# Patient Record
Sex: Male | Born: 1960 | Race: Black or African American | Hispanic: No | Smoking: Never smoker
Health system: Southern US, Community
[De-identification: ages and names within clinical notes are randomized; demographics above are authoritative.]

## PROBLEM LIST (undated history)

## (undated) DIAGNOSIS — M1990 Unspecified osteoarthritis, unspecified site: Secondary | ICD-10-CM

## (undated) DIAGNOSIS — F319 Bipolar disorder, unspecified: Secondary | ICD-10-CM

---

## 2005-09-18 ENCOUNTER — Emergency Department: Payer: Self-pay | Admitting: Emergency Medicine

## 2008-07-29 ENCOUNTER — Ambulatory Visit: Payer: Self-pay

## 2008-10-18 ENCOUNTER — Emergency Department: Payer: Self-pay | Admitting: Emergency Medicine

## 2011-06-05 ENCOUNTER — Emergency Department: Payer: Self-pay | Admitting: Emergency Medicine

## 2015-02-13 ENCOUNTER — Emergency Department
Admission: EM | Admit: 2015-02-13 | Discharge: 2015-02-13 | Disposition: A | Discharge status: Home / Self Care | Payer: Self-pay | Attending: Emergency Medicine | Admitting: Emergency Medicine

## 2015-02-13 ENCOUNTER — Encounter: Payer: Self-pay | Admitting: Emergency Medicine

## 2015-02-13 DIAGNOSIS — Z202 Contact with and (suspected) exposure to infections with a predominantly sexual mode of transmission: Secondary | ICD-10-CM | POA: Insufficient documentation

## 2015-02-13 DIAGNOSIS — N341 Nonspecific urethritis: Secondary | ICD-10-CM | POA: Insufficient documentation

## 2015-02-13 HISTORY — DX: Bipolar disorder, unspecified: F31.9

## 2015-02-13 HISTORY — DX: Unspecified osteoarthritis, unspecified site: M19.90

## 2015-02-13 LAB — URINALYSIS COMPLETE WITH MICROSCOPIC (ARMC ONLY)
Bacteria, UA: NONE SEEN
Bilirubin Urine: NEGATIVE
GLUCOSE, UA: NEGATIVE mg/dL
Hgb urine dipstick: NEGATIVE
Nitrite: NEGATIVE
PROTEIN: NEGATIVE mg/dL
Specific Gravity, Urine: 1.019 (ref 1.005–1.030)
Squamous Epithelial / LPF: NONE SEEN
pH: 6 (ref 5.0–8.0)

## 2015-02-13 LAB — CHLAMYDIA/NGC RT PCR (ARMC ONLY)
CHLAMYDIA TR: NOT DETECTED
N gonorrhoeae: DETECTED — AB

## 2015-02-13 MED ORDER — AZITHROMYCIN 250 MG PO TABS
1000.0000 mg | ORAL_TABLET | Freq: Once | ORAL | Status: AC
  Administered 2015-02-13: 1000 mg via ORAL
  Filled 2015-02-13: qty 4

## 2015-02-13 MED ORDER — CEFTRIAXONE SODIUM 250 MG IJ SOLR
250.0000 mg | Freq: Once | INTRAMUSCULAR | Status: AC
  Administered 2015-02-13: 250 mg via INTRAMUSCULAR
  Filled 2015-02-13: qty 250

## 2015-02-13 NOTE — ED Notes (Signed)
Patient reports white penile discharge. Believes he may have STD.

## 2015-02-13 NOTE — ED Provider Notes (Signed)
[facility] Emergency Department Provider Note  ____________________________________________  Time seen: Approximately 8:26 AM  I have reviewed the triage vital signs and the nursing notes.   HISTORY  Chief Complaint Exposure to STD and Penile Discharge   HPI Blake Hall is a 54 y.o. male presents for evaluation of penile discharge 1. Patient states recently sexual way active with a new partner approximately 3 days ago. Denies any other symptoms. Complains of burning upon urination as well.   Past Medical History  Diagnosis Date  . Arthritis   . Bipolar 1 disorder (HCC)     There are no active problems to display for this patient.   History reviewed. No pertinent past surgical history.  No current outpatient prescriptions on file.  Allergies Review of patient's allergies indicates no known allergies.  No family history on file.  Social History Social History  Substance Use Topics  . Smoking status: Never Smoker   . Smokeless tobacco: None  . Alcohol Use: Yes    Review of Systems Constitutional: No fever/chills Eyes: No visual changes. ENT: No sore throat. Cardiovascular: Denies chest pain. Respiratory: Denies shortness of breath. Gastrointestinal: No abdominal pain.  No nausea, no vomiting.  No diarrhea.  No constipation. Genitourinary: Negative for dysuria. Positive for penile discharge. Musculoskeletal: Negative for back pain. Skin: Negative for rash. Neurological: Negative for headaches, focal weakness or numbness.  10-point ROS otherwise negative.  ____________________________________________   PHYSICAL EXAM:  VITAL SIGNS: ED Triage Vitals  Enc Vitals Group     BP 02/13/15 0819 111/95 mmHg     Pulse Rate 02/13/15 0819 85     Resp 02/13/15 0819 16     Temp 02/13/15 0819 97.8 F (36.6 C)     Temp Source 02/13/15 0819 Oral     SpO2 02/13/15 0819 100 %     Weight 02/13/15 0819 150 lb (68.04 kg)     Height 02/13/15  0819  (1.676 m)     Head Cir --      Peak Flow --      Pain Score 02/13/15 0820 3     Pain Loc --      Pain Edu? --      Excl. in GC? --     Constitutional: Alert and oriented. Well appearing and in no acute distress.   Cardiovascular: Normal rate, regular rhythm. Grossly normal heart sounds.  Good peripheral circulation. Respiratory: Normal respiratory effort.  No retractions. Lungs CTAB. Gastrointestinal: Soft and nontender. No distention. No abdominal bruits. No CVA tenderness. Genitourinary:  Musculoskeletal: No lower extremity tenderness nor edema.  No joint effusions. Neurologic:  Normal speech and language. No gross focal neurologic deficits are appreciated. No gait instability. Skin:  Skin is warm, dry and intact. No rash noted. Psychiatric: Mood and affect are normal. Speech and behavior are normal.  ____________________________________________   LABS (all labs ordered are listed, but only abnormal results are displayed)  Labs Reviewed  URINALYSIS COMPLETEWITH MICROSCOPIC ([facility] ONLY) - Abnormal; Notable for the following:    Color, Urine YELLOW (*)    APPearance HAZY (*)    Ketones, ur TRACE (*)    Leukocytes, UA 3+ (*)    All other components within normal limits  CHLAMYDIA/NGC RT PCR ([facility] ONLY)   ____________________________________________   PROCEDURES  Procedure(s) performed: None  Critical Care performed: No  ____________________________________________   INITIAL IMPRESSION / ASSESSMENT AND PLAN / ED COURSE  Pertinent labs & imaging results that were available during  my care of the patient were reviewed by me and considered in my medical decision making (see chart for details).  Acute urethritis. Rocephin 250 IM, azithromycin 1 g by mouth. Both given in the ED. Patient to notify all sexual contacts about discharge and to have him follow up with her PCP. Patient voices no other emergency medical complaints at this  time. ____________________________________________   FINAL CLINICAL IMPRESSION(S) / ED DIAGNOSES  Final diagnoses:  Urethritis, nonspecific       Evangeline DakinCharles M Casey Maxfield, PA-C 02/13/15 [id number]  Myrna Blazeravid Matthew Schaevitz, MD 02/13/15 [phone]

## 2015-02-13 NOTE — Discharge Instructions (Signed)
Urethritis, Adult °Urethritis is an inflammation of the tube through which urine exits your bladder (urethra).  °CAUSES °Urethritis is often caused by an infection in your urethra. The infection can be viral, like herpes. The infection can also be bacterial, like gonorrhea. °RISK FACTORS °Risk factors of urethritis include: °· Having sex without using a condom. °· Having multiple sexual partners. °· Having poor hygiene. °SIGNS AND SYMPTOMS °Symptoms of urethritis are less noticeable in women than in men. These symptoms include: °· Burning feeling when you urinate (dysuria). °· Discharge from your urethra. °· Blood in your urine (hematuria). °· Urinating more than usual. °DIAGNOSIS  °To confirm a diagnosis of urethritis, your health care provider will do the following: °· Ask about your sexual history. °· Perform a physical exam. °· Have you provide a sample of your urine for lab testing. °· Use a cotton swab to gently collect a sample from your urethra for lab testing. °TREATMENT  °It is important to treat urethritis. Depending on the cause, untreated urethritis may lead to serious genital infections and possibly infertility. Urethritis caused by a bacterial infection is treated with antibiotic medicine. All sexual partners must be treated.  °HOME CARE INSTRUCTIONS °· Do not have sex until the test results are known and treatment is completed, even if your symptoms go away before you finish treatment. °· If you were prescribed an antibiotic, finish it all even if you start to feel better. °SEEK MEDICAL CARE IF:  °· Your symptoms are not improved in 3 days. °· Your symptoms are getting worse. °· You develop abdominal pain or pelvic pain (in women). °· You develop joint pain. °· You have a fever. °SEEK IMMEDIATE MEDICAL CARE IF:  °· You have severe pain in the belly, back, or side. °· You have repeated vomiting. °MAKE SURE YOU: °· Understand these instructions. °· Will watch your condition. °· Will get help right away  if you are not doing well or get worse. °  °This information is not intended to replace advice given to you by your health care provider. Make sure you discuss any questions you have with your health care provider. °  °Document Released: 08/01/2000 Document Revised: 06/22/2014 Document Reviewed: 10/06/2012 °Elsevier Interactive Patient Education ©2016 [employer] ° °

## 2015-02-16 ENCOUNTER — Telehealth: Payer: Self-pay | Admitting: Emergency Medicine

## 2015-02-16 NOTE — ED Notes (Signed)
Called pt to inform of positive gonorrhea test. Patient was treated during his visit, but needs to notify partners to get treatment at [facility] department.  No answer and no voicemail.  Will send letter.

## 2020-10-26 ENCOUNTER — Other Ambulatory Visit: Payer: Self-pay | Admitting: Radiation Oncology

## 2020-10-26 DIAGNOSIS — G589 Mononeuropathy, unspecified: Secondary | ICD-10-CM

## 2020-10-26 DIAGNOSIS — M25551 Pain in right hip: Secondary | ICD-10-CM

## 2020-11-11 ENCOUNTER — Other Ambulatory Visit: Payer: Self-pay | Admitting: Radiation Oncology

## 2020-11-11 ENCOUNTER — Ambulatory Visit
Admission: RE | Admit: 2020-11-11 | Discharge: 2020-11-11 | Disposition: A | Discharge status: Home / Self Care | Payer: Disability Insurance | Attending: Radiation Oncology | Admitting: Radiation Oncology

## 2020-11-11 ENCOUNTER — Ambulatory Visit
Admission: RE | Admit: 2020-11-11 | Discharge: 2020-11-11 | Disposition: A | Discharge status: Home / Self Care | Payer: Disability Insurance | Source: Ambulatory Visit | Attending: Radiation Oncology | Admitting: Radiation Oncology

## 2020-11-11 ENCOUNTER — Other Ambulatory Visit: Payer: Self-pay

## 2020-11-11 DIAGNOSIS — M25551 Pain in right hip: Secondary | ICD-10-CM | POA: Diagnosis present

## 2020-11-11 DIAGNOSIS — G589 Mononeuropathy, unspecified: Secondary | ICD-10-CM | POA: Diagnosis present

## 2022-03-18 IMAGING — CR DG HIP (WITH OR WITHOUT PELVIS) 2-3V*R*
1 series · 3 of 3 positions shown · non-contrast
Comparison: None.

CLINICAL DATA: Chronic right hip pain

EXAM:
DG HIP (WITH OR WITHOUT PELVIS) 2-3V RIGHT

[Series 1: dg hip unilat w or w/o pelvis 2-3 views  · non-contrast · 0.14mm/px · 3 of 3 slices shown]
[im 1/3]
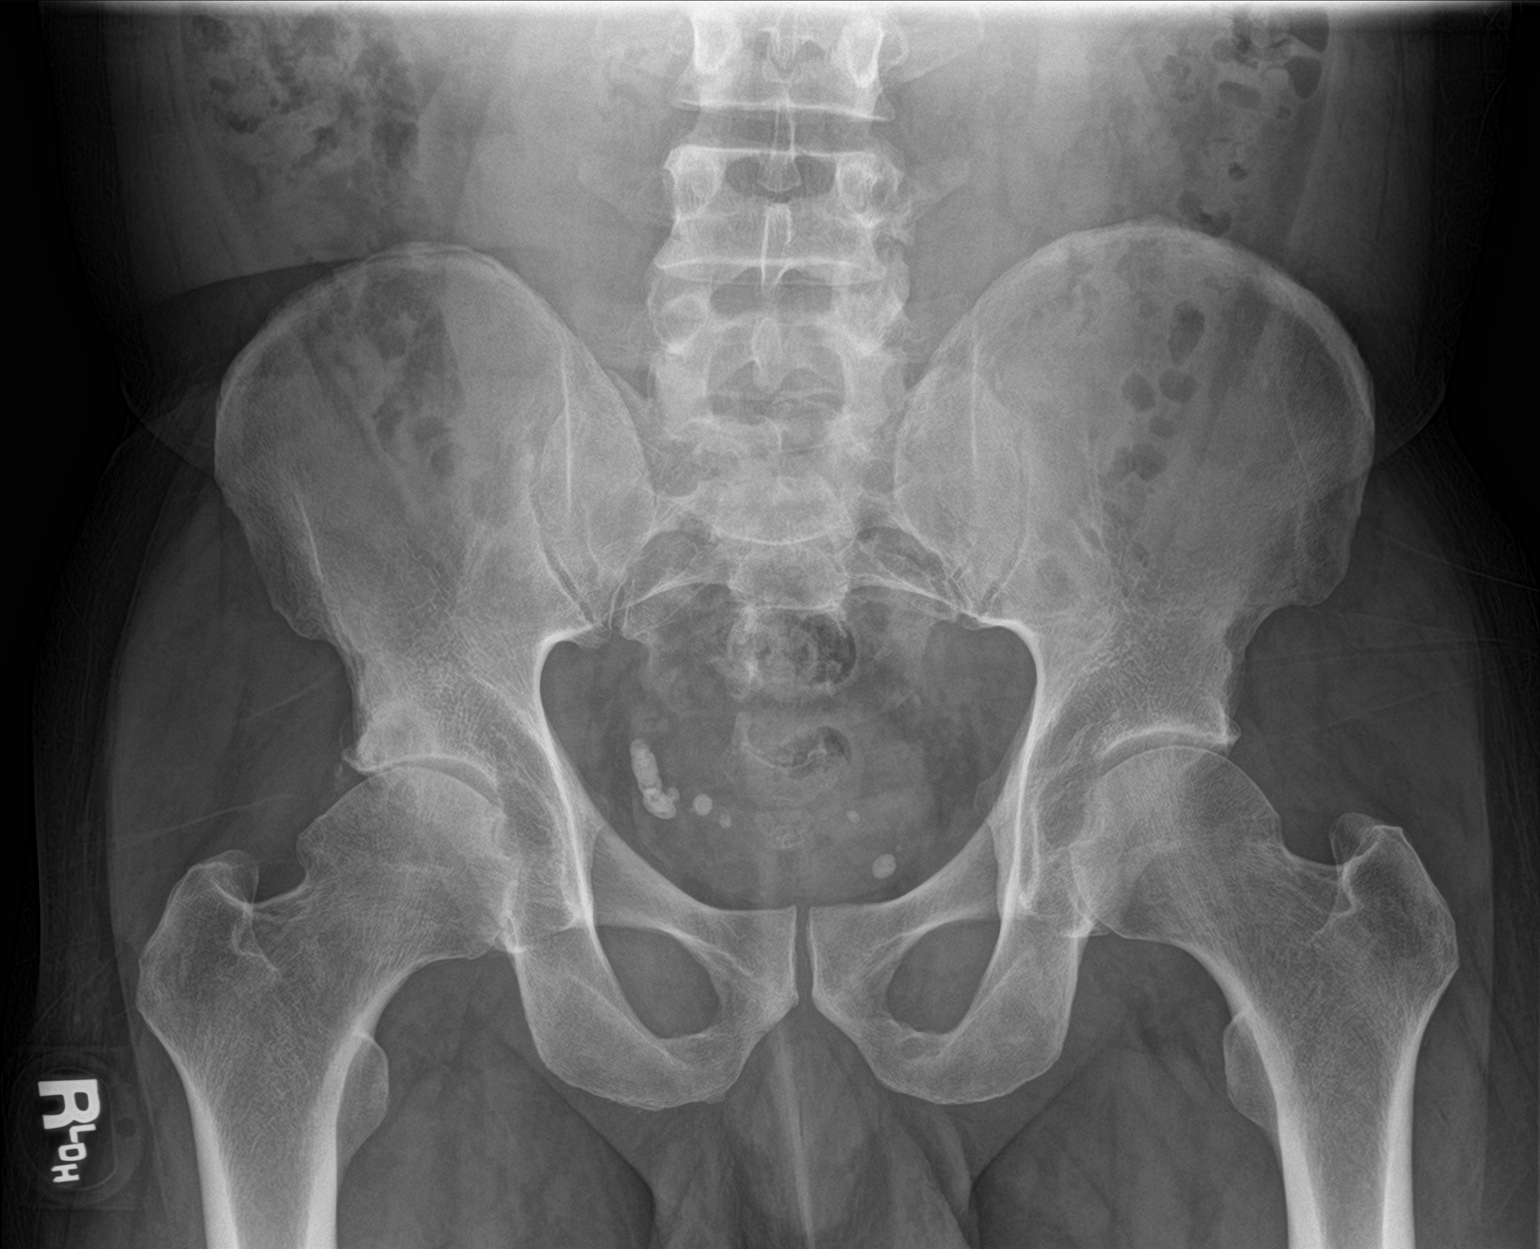
[im 2/3]
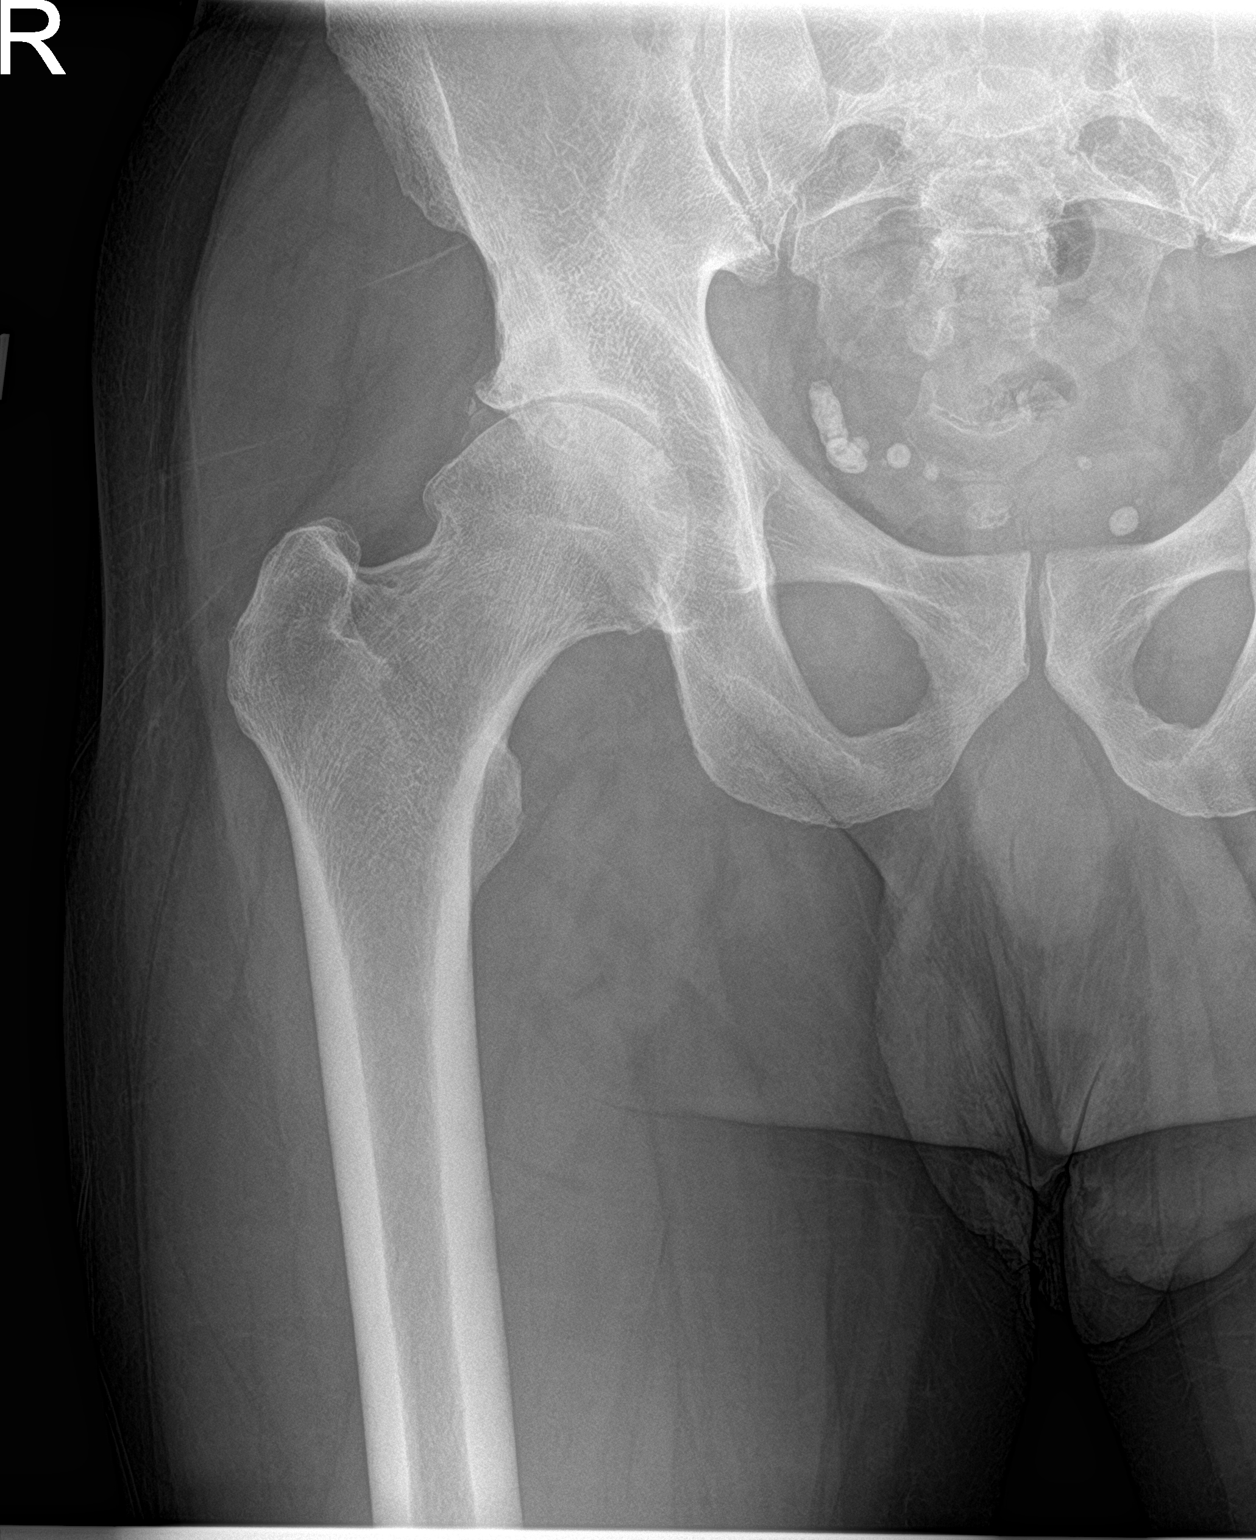
[im 3/3]
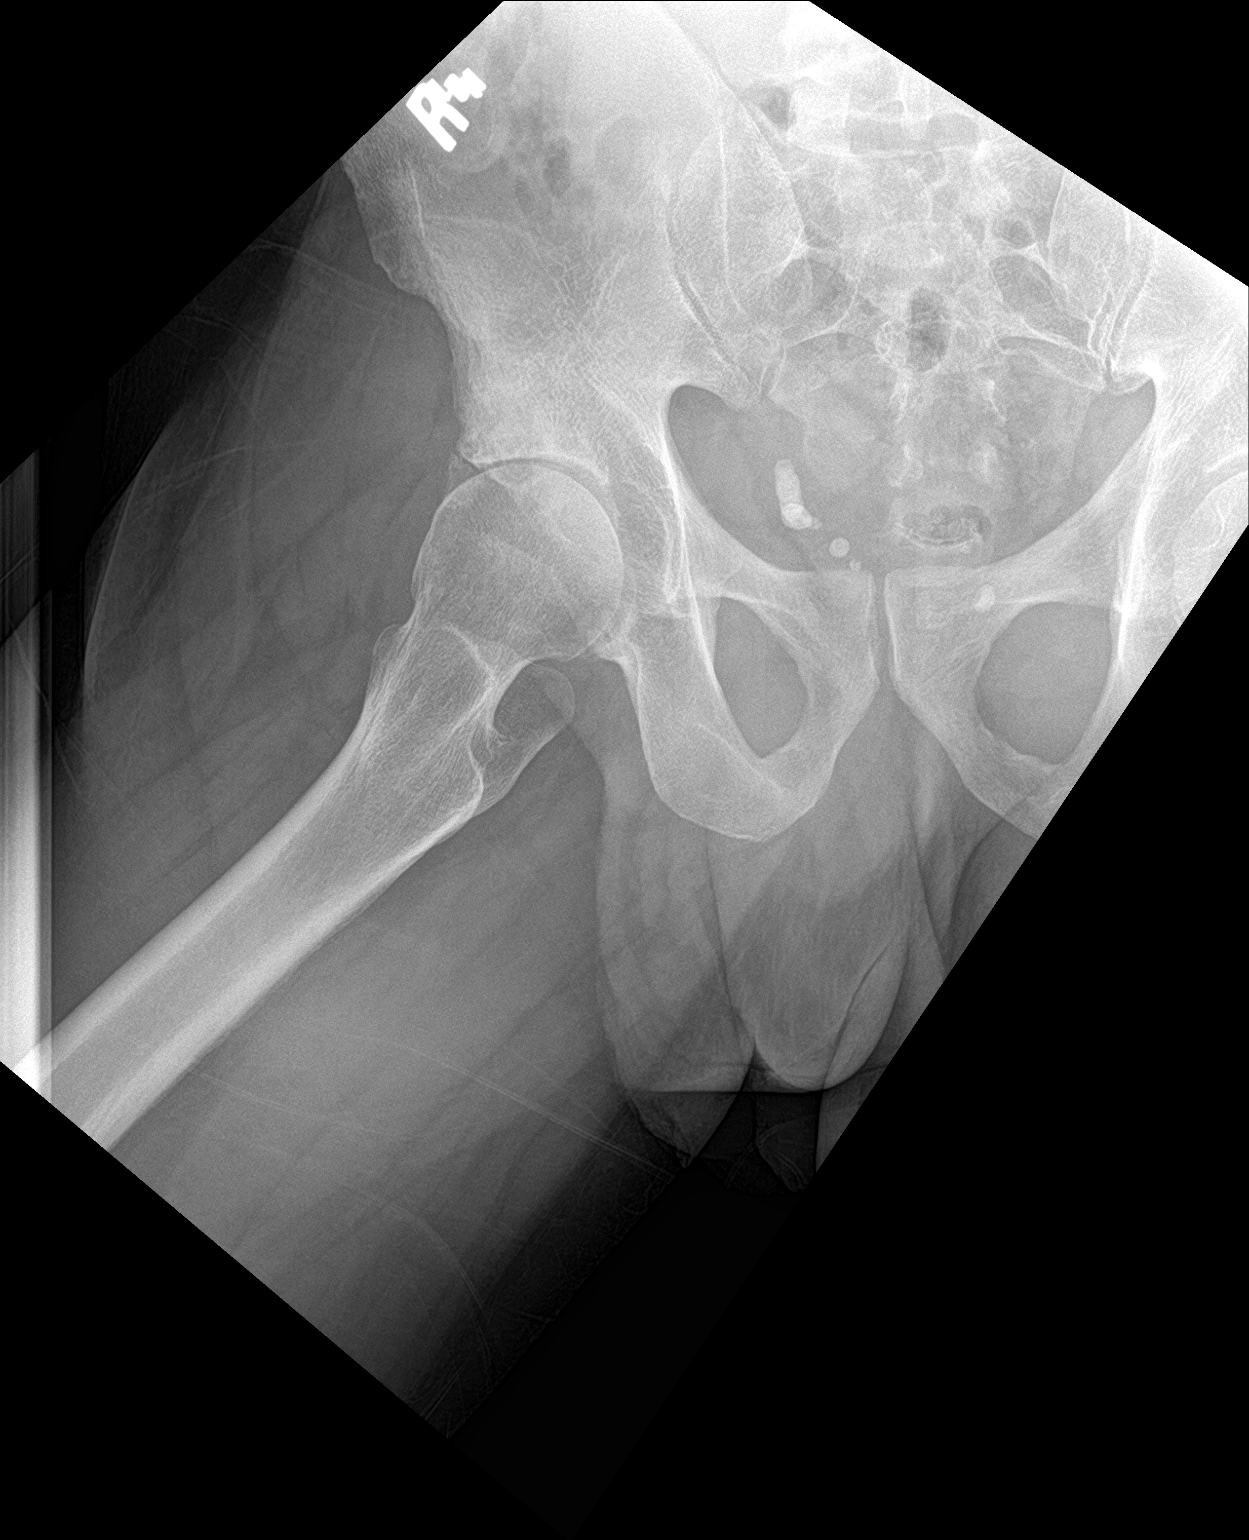

[3 of 3 positions shown; findings below may reference images not displayed]

FINDINGS: Normal alignment. No fracture or dislocation. There is moderate
asymmetric right hip degenerative arthritis with subchondral cyst
formation within the femoral head and acetabular roof. There is
reduced spherocity of the right femoral head suggesting changes of
cam type hip impingement. Mild left hip degenerative arthritis is
noted. Vascular calcifications are seen within the pelvis.
IMPRESSION: Moderate asymmetric right hip degenerative arthritis, possibly
related to changes of underlying hip impingement. This could be
better assessed with MRI examination, if indicated.

## 2022-03-18 IMAGING — CR DG LUMBAR SPINE 2-3V
1 series · 3 of 3 positions shown · non-contrast
Comparison: None.

CLINICAL DATA: Chronic low back and right hip pain

EXAM:
LUMBAR SPINE - 2-3 VIEW

[Series 1: dg lumbar spine 2-3 views · 0.14mm/px · 3 of 3 slices shown]
[im 1/3]
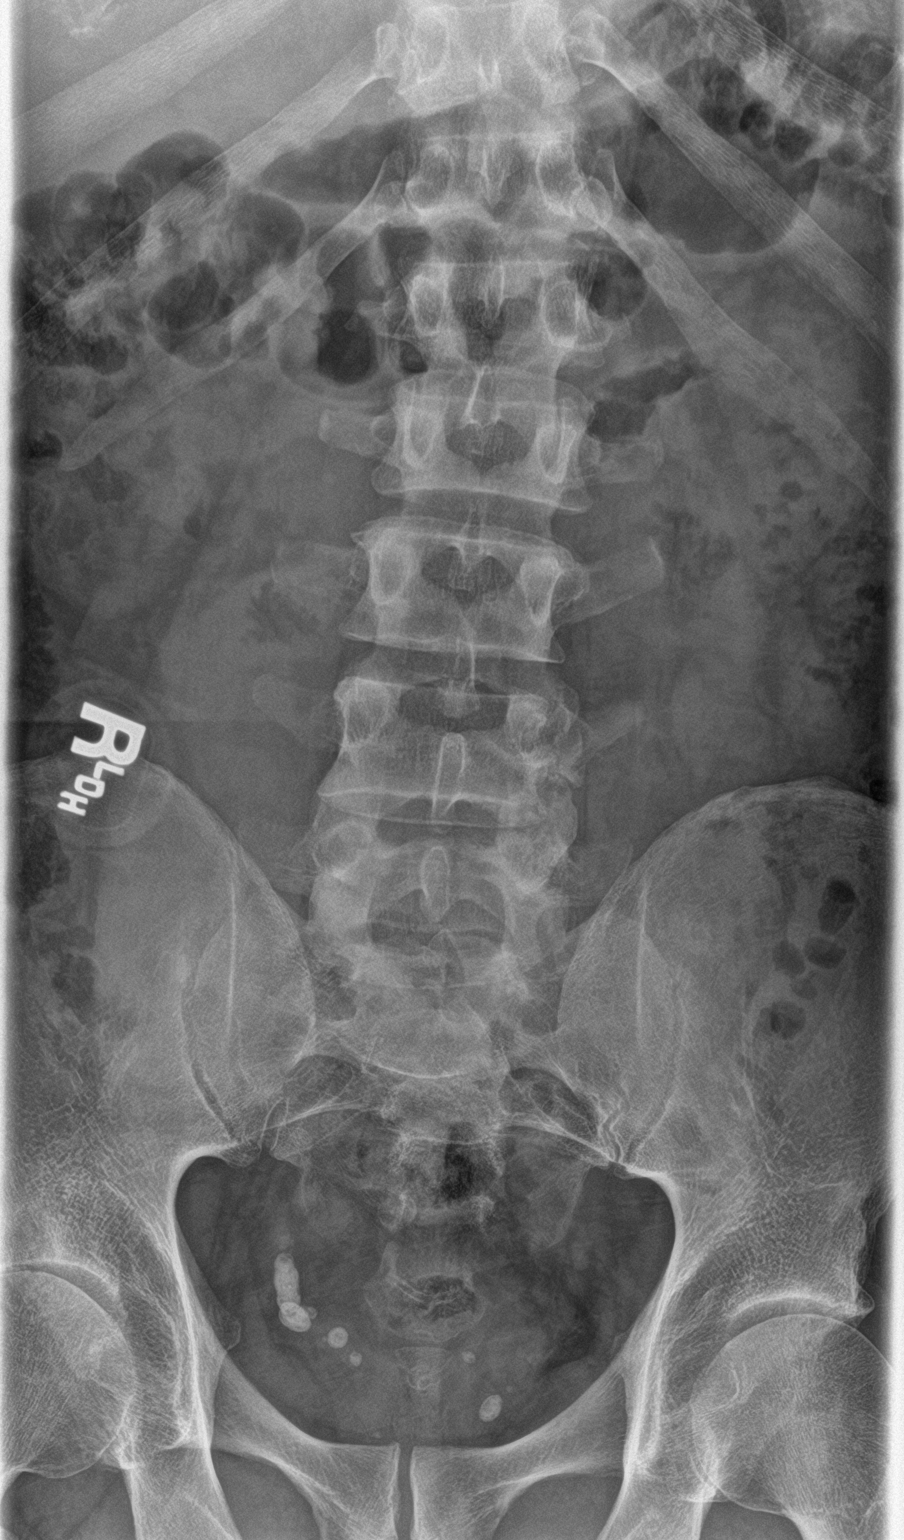
[im 2/3]
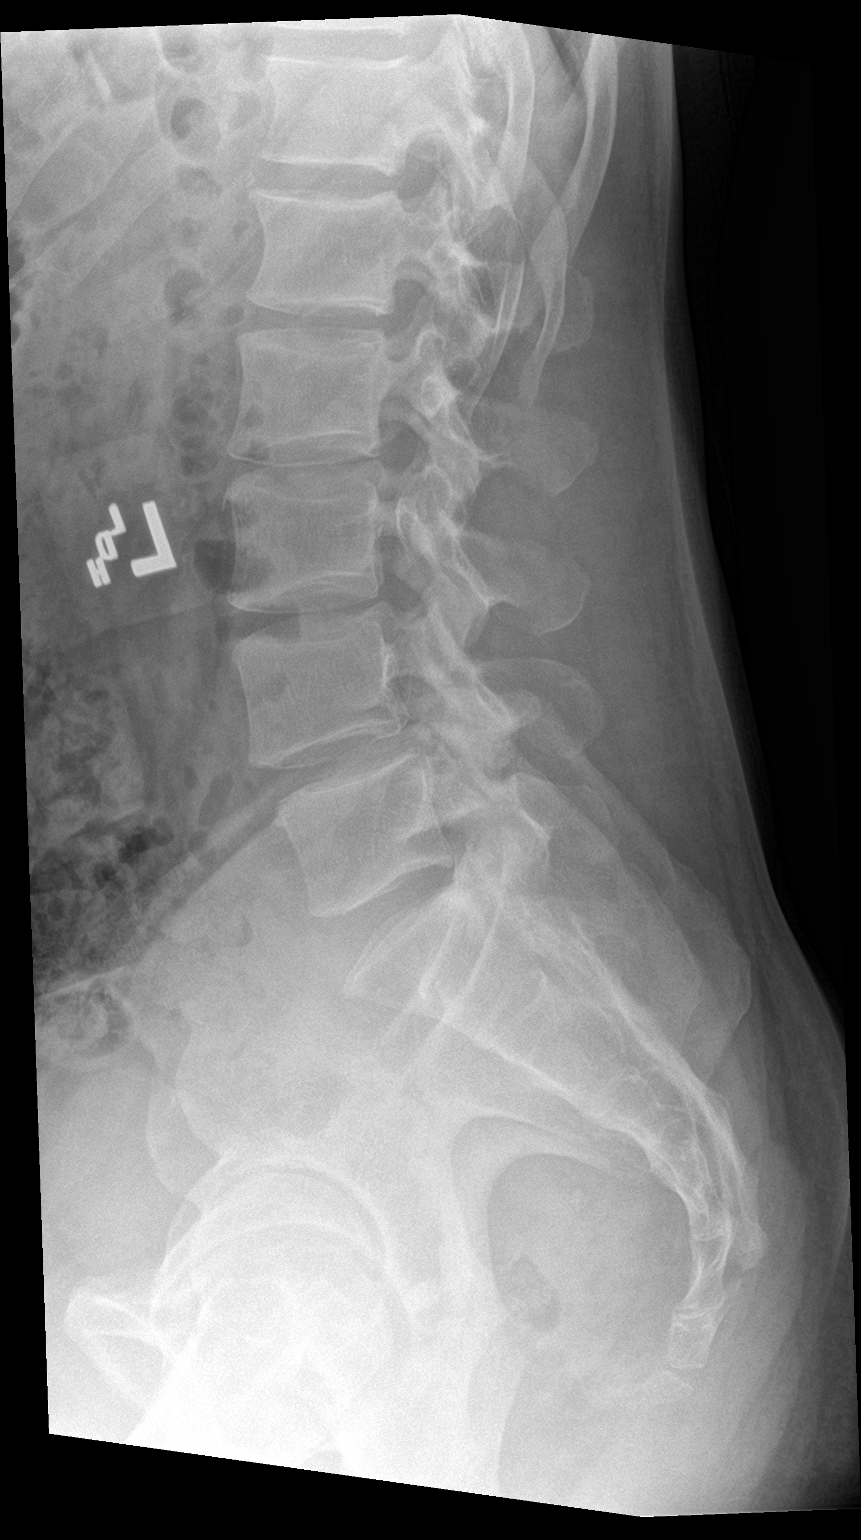
[im 3/3]
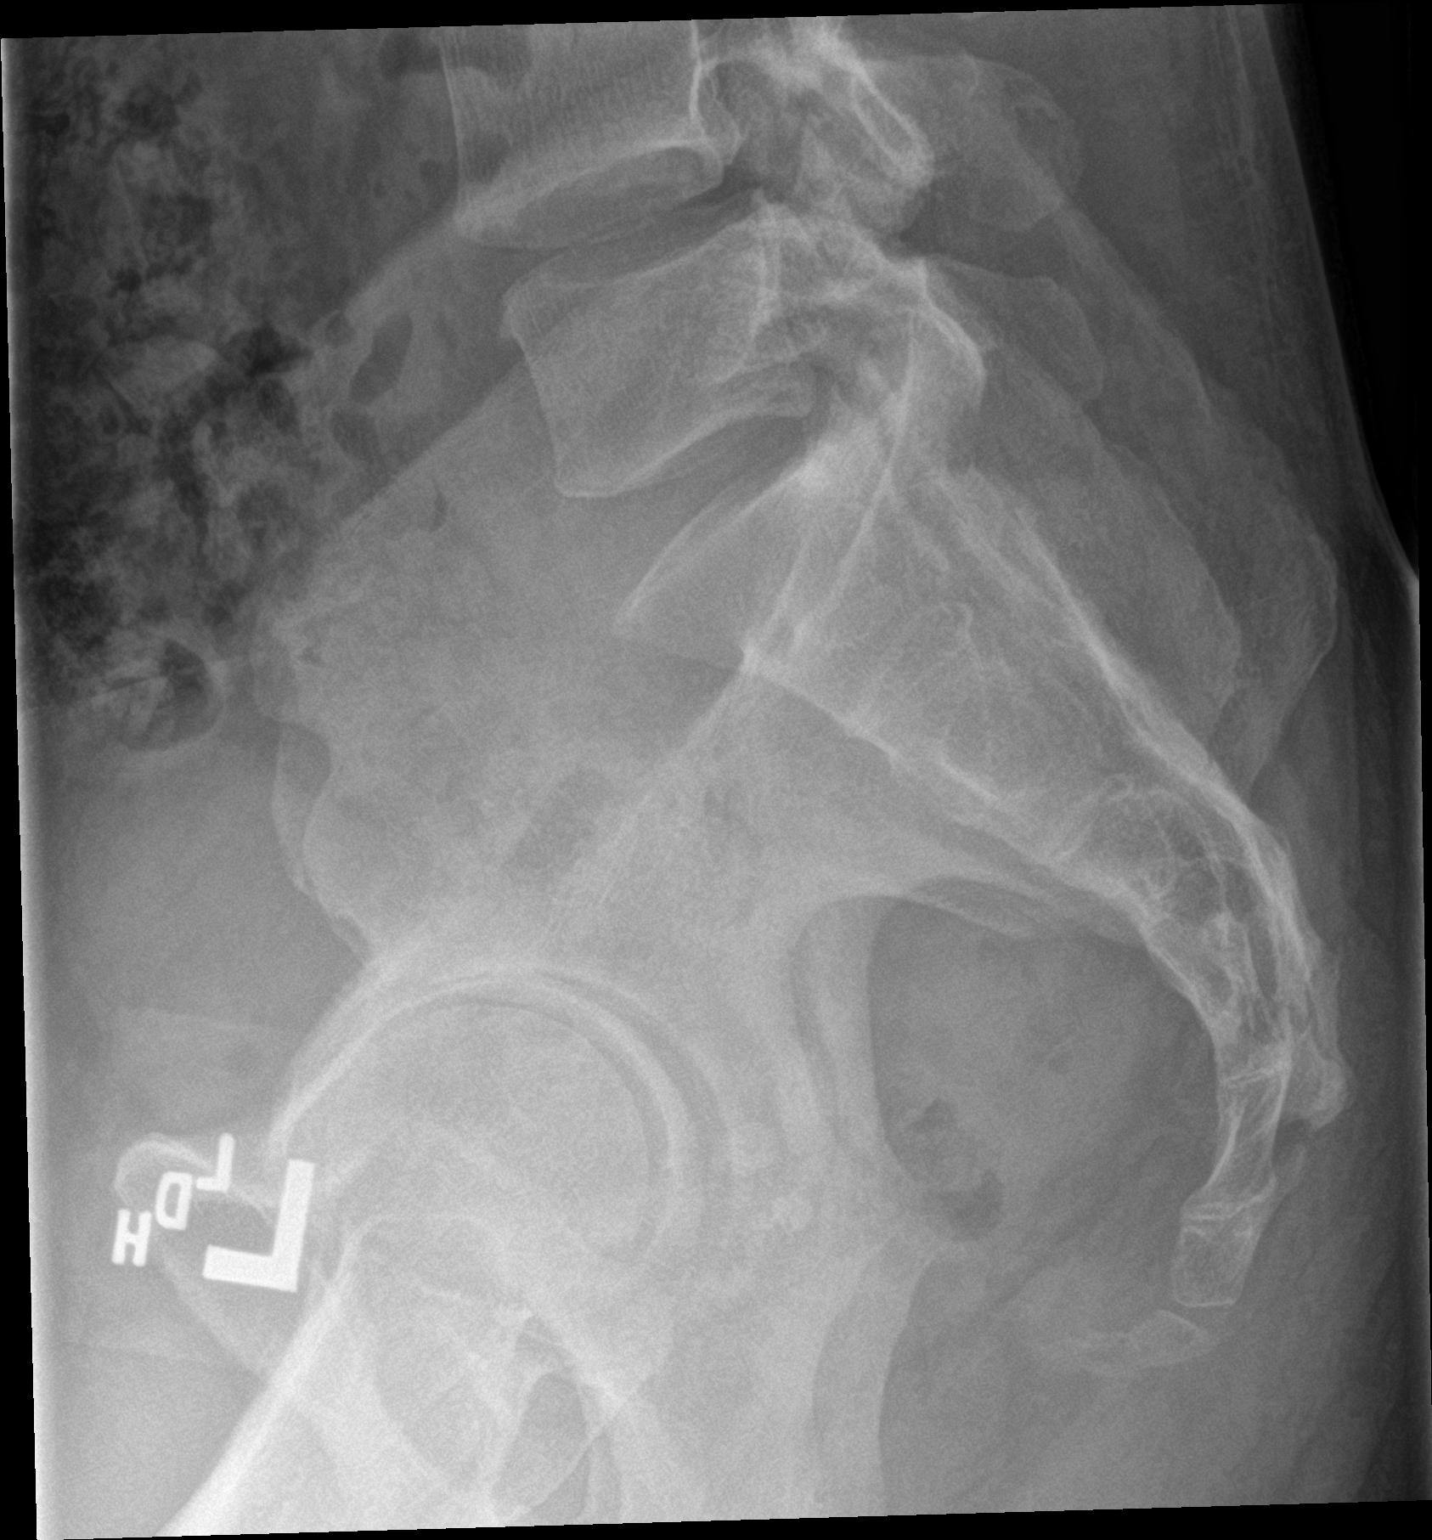

[3 of 3 positions shown; findings below may reference images not displayed]

FINDINGS: Mild thoracolumbar levocurvature, apex left at T12, incompletely
visualized on this examination. Normal lumbar lordosis. No acute
fracture or listhesis of the lumbar spine. Vertebral body height and
intervertebral disc heights are preserved. There is asymmetric left
L4-5 facet arthrosis noted, not well profiled on this examination.
The paraspinal soft tissues are otherwise unremarkable. Incidentally
noted is asymmetric right hip joint space narrowing, in keeping with
asymmetric right hip degenerative arthritis
IMPRESSION: Asymmetric left L4-5 facet arthrosis, not well profiled on this
examination.

Asymmetric right hip degenerative arthritis, incompletely included
on this exam

## 2023-06-10 ENCOUNTER — Ambulatory Visit: Payer: Self-pay | Admitting: Internal Medicine

## 2023-11-14 ENCOUNTER — Other Ambulatory Visit: Payer: Self-pay

## 2023-11-14 ENCOUNTER — Ambulatory Visit (INDEPENDENT_AMBULATORY_CARE_PROVIDER_SITE_OTHER)

## 2023-11-14 ENCOUNTER — Telehealth: Payer: Self-pay

## 2023-11-14 VITALS — BP 110/70 | HR 77 | Ht 66.0 in | Wt 165.6 lb

## 2023-11-14 DIAGNOSIS — L731 Pseudofolliculitis barbae: Secondary | ICD-10-CM | POA: Diagnosis not present

## 2023-11-14 DIAGNOSIS — M1611 Unilateral primary osteoarthritis, right hip: Secondary | ICD-10-CM

## 2023-11-14 DIAGNOSIS — Z1211 Encounter for screening for malignant neoplasm of colon: Secondary | ICD-10-CM

## 2023-11-14 DIAGNOSIS — Z125 Encounter for screening for malignant neoplasm of prostate: Secondary | ICD-10-CM | POA: Diagnosis not present

## 2023-11-14 DIAGNOSIS — Z0000 Encounter for general adult medical examination without abnormal findings: Secondary | ICD-10-CM

## 2023-11-14 DIAGNOSIS — M1990 Unspecified osteoarthritis, unspecified site: Secondary | ICD-10-CM | POA: Insufficient documentation

## 2023-11-14 MED ORDER — TRETINOIN 0.025 % EX CREA: TOPICAL_CREAM | Freq: Every day | CUTANEOUS | 2 refills | Status: DC

## 2023-11-14 MED ORDER — NA SULFATE-K SULFATE-MG SULF 17.5-3.13-1.6 GM/177ML PO SOLN: 1.0000 | Freq: Once | ORAL | 0 refills | Status: AC

## 2023-11-14 NOTE — Telephone Encounter (Signed)
 Gastroenterology Pre-Procedure Review  Request Date: 02/03/24 Requesting Physician: Dr. Melany  PATIENT REVIEW QUESTIONS: The patient responded to the following health history questions as indicated:    1. Are you having any GI issues? no 2. Do you have a personal history of Polyps? no 3. Do you have a family history of Colon Cancer or Polyps? no 4. Diabetes Mellitus? no 5. Joint replacements in the past 12 months?no 6. Major health problems in the past 3 months?no 7. Any artificial heart valves, MVP, or defibrillator?no    MEDICATIONS & ALLERGIES:    Patient reports the following regarding taking any anticoagulation/antiplatelet therapy:   Plavix, Coumadin, Eliquis, Xarelto, Lovenox, Pradaxa, Brilinta, or Effient? no Aspirin? no  Patient confirms/reports the following medications:  Current Outpatient Medications  Medication Sig Dispense Refill   tretinoin  (RETIN-A ) 0.025 % cream Apply topically at bedtime. Will increase sun sensitivity, use sunscreen 45 g 2   No current facility-administered medications for this visit.    Patient confirms/reports the following allergies:  No Known Allergies  No orders of the defined types were placed in this encounter.   AUTHORIZATION INFORMATION Primary Insurance: 1D#: Group #:  Secondary Insurance: 1D#: Group #:  SCHEDULE INFORMATION: Date: 02/03/24 Time: Location: [facility]

## 2023-11-14 NOTE — Progress Notes (Signed)
 New Patient Visit   Physician: Kelsey Durflinger A Keoni Risinger, MD  Patient: Blake Hall   DOB: 1960-12-07   63 y.o. Male  MRN: [id number] Visit Date: 11/14/2023   Chief Complaint  Patient presents with   Establish Care   Subjective  Blake Hall is a 63 y.o. male who presents today as a new patient to establish care.   HPI  Discussed the use of AI scribe software for clinical note transcription with the patient, who gave verbal consent to proceed.  History of Present Illness   Blake Hall is a 63 year old male with degenerative arthritis in the right hip who presents for a routine check-up.  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  Last imaging in 2022.   Will need replacement eventually  Mood disturbance - History of depression managed through spiritual practices and self-regulation - No current use of antidepressant medications - Improved coping over time  General health maintenance and screening - No current medication use - No regular primary care physician prior to this visit - No history of colonoscopy; interested in obtaining screening  - Declines vaccines  Constitutional and cardiopulmonary symptoms - No chest pain - No shortness of breath - No hearing issues - Good sleep quality  Lifestyle and activity level - Does not smoke, drink alcohol, or use drugs - Maintains a diet with a good variety of fruits and vegetables, healthy overall        ASSESSMENT & PLAN  Encounter Diagnoses  Name Primary?   Prostate cancer screening Yes   Physical exam, routine    Osteoarthritis of  right hip, unspecified osteoarthritis type    Colon cancer screening    Pseudofolliculitis barbae     Orders Placed This Encounter  Procedures   CBC with Differential/Platelet   Comprehensive metabolic panel with GFR   Hemoglobin A1c   Lipid panel   Urinalysis, Routine w reflex microscopic   PSA   ABO AND RH    Ambulatory referral to Gastroenterology    Assessment and Plan    Degenerative arthritis of right hip Chronic arthritis secondary to past dislocation. Discussed potential hip replacement before age 81 to optimize recovery and minimize age-related complications. - Consider timing for hip replacement surgery before age 40. - Encourage maintaining activity levels and consider light weight exercises to preserve muscle mass.  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 - Prescribe topical tretinoin  for application to affected areas. - Advise on exfoliation techniques using a washcloth to prevent ingrown hairs.        Fu in 3 weeks to review labs     Objective  BP 110/70 (BP Location: Left Arm, Patient Position: Sitting, Cuff Size: Normal)   Pulse 77   Ht 5' 6 (1.676 m)   Wt 165 lb 9.6 oz (75.1 kg)   SpO2 97%   BMI 26.73 kg/m      Review of Systems  Constitutional:  Negative for chills, fever and weight loss.  Eyes:  Negative for blurred vision. h Respiratory:  Negative for cough and shortness of breath.   Cardiovascular:  Negative for chest pain and palpitations.  Skin:  Negative for rash.  Psychiatric/Behavioral:  Negative for depression. The patient is not nervous/anxious.      Physical Exam Physical Exam Vitals reviewed.  Constitutional:      Appearance: Normal appearance. Well-developed with normal weight.  HENT:     Head: Normocephalic and atraumatic.  Normal mucous membranes, no oral lesions Eyes:     Pupils: Pupils are equal, round, and reactive to light.  Neck:     Thyroid: No thyroid mass or thyromegaly.   Cardiovascular:     Rate and Rhythm: Normal rate and regular rhythm. Normal heart sounds. Normal peripheral pulses Pulmonary:     Normal breath sounds with normal effort Abdominal:   Abdomen is soft, without tenderness or noted hepatosplenomegaly Musculoskeletal:        General: No swelling or edema  Lymphadenopathy:     Cervical: No cervical adenopathy.  Skin:    General: Skin is warm and dry without noticeable rash.  Few papules due to ingrown hairs neck Neurological:     General: No focal deficit present.  Psychiatric:        Mood and Affect: Mood, behavior and cognition normal   Past Medical History:  Diagnosis Date   Arthritis    Bipolar 1 disorder (HCC)    No past surgical history on file. No family status information on file.   No family history on file. Social History   Socioeconomic History   Marital status: Divorced    Spouse name: Not on file   Number of children: Not on file   Years of education: Not on file   Highest education level: Not on file  Occupational History   Not on file  Tobacco Use   Smoking status: Never   Smokeless tobacco: Never  Vaping Use   Vaping status: Never Used  Substance and Sexual Activity   Alcohol use: Not Currently   Drug use: Not Currently    Types: Marijuana, Cocaine   Sexual activity: Not Currently  Other Topics Concern   Not on file  Social History Narrative   Not on file   Social Drivers of Health   Financial Resource Strain: Not on file  Food Insecurity: Not on file  Transportation Needs: Not on file  Physical Activity: Not on file  Stress: Not on file  Social Connections: Not on file   No outpatient medications prior to visit.   No facility-administered medications prior to visit.   No Known Allergies   There is no immunization history on file for this patient.  Health Maintenance  Topic Date Due   HIV Screening  Never done   Hepatitis C Screening  Never done   DTaP/Tdap/Td (1 - Tdap) Never done    Colonoscopy  Never done   Pneumococcal Vaccine: 50+ Years (1 of 1 - PCV) Never done   Zoster Vaccines- Shingrix (1 of 2) Never done   Influenza Vaccine  Never done   COVID-19 Vaccine (1 - 2024-25 season) Never done   Hepatitis B Vaccines 19-59 Average Risk  Aged Out   HPV VACCINES  Aged Out   Meningococcal B Vaccine  Aged Out    Patient Care Team: Everlene Parris LABOR, MD as PCP - General (Family Medicine)  Depression Screen     No data to display           Parris LABOR Everlene, MD  [facility] [phone] (phone) [phone] (fax)  [facility]

## 2023-11-15 LAB — CBC WITH DIFFERENTIAL/PLATELET
Absolute Lymphocytes: 2105 {cells}/uL (ref 850–3900)
Absolute Monocytes: 730 {cells}/uL (ref 200–950)
Basophils Absolute: 38 {cells}/uL (ref 0–200)
Basophils Relative: 0.5 %
Eosinophils Absolute: 122 {cells}/uL (ref 15–500)
Eosinophils Relative: 1.6 %
HCT: 41.6 % (ref 38.5–50.0)
Hemoglobin: 12.6 g/dL — ABNORMAL LOW (ref 13.2–17.1)
MCH: 26 pg — ABNORMAL LOW (ref 27.0–33.0)
MCHC: 30.3 g/dL — ABNORMAL LOW (ref 32.0–36.0)
MCV: 85.8 fL (ref 80.0–100.0)
MPV: 9.5 fL (ref 7.5–12.5)
Monocytes Relative: 9.6 %
Neutro Abs: 4606 {cells}/uL (ref 1500–7800)
Neutrophils Relative %: 60.6 %
Platelets: 467 Thousand/uL — ABNORMAL HIGH (ref 140–400)
RBC: 4.85 Million/uL (ref 4.20–5.80)
RDW: 12.4 % (ref 11.0–15.0)
Total Lymphocyte: 27.7 %
WBC: 7.6 Thousand/uL (ref 3.8–10.8)

## 2023-11-15 LAB — COMPREHENSIVE METABOLIC PANEL WITH GFR
AG Ratio: 1.8 (calc) (ref 1.0–2.5)
ALT: 12 U/L (ref 9–46)
AST: 13 U/L (ref 10–35)
Albumin: 3.5 g/dL — ABNORMAL LOW (ref 3.6–5.1)
Alkaline phosphatase (APISO): 69 U/L (ref 35–144)
BUN: 8 mg/dL (ref 7–25)
CO2: 31 mmol/L (ref 20–32)
Calcium: 8.9 mg/dL (ref 8.6–10.3)
Chloride: 103 mmol/L (ref 98–110)
Creat: 0.97 mg/dL (ref 0.70–1.35)
Globulin: 2 g/dL (ref 1.9–3.7)
Glucose, Bld: 196 mg/dL — ABNORMAL HIGH (ref 65–99)
Potassium: 5 mmol/L (ref 3.5–5.3)
Sodium: 140 mmol/L (ref 135–146)
Total Bilirubin: 0.3 mg/dL (ref 0.2–1.2)
Total Protein: 5.5 g/dL — ABNORMAL LOW (ref 6.1–8.1)
eGFR: 88 mL/min/1.73m2 (ref >=60)

## 2023-11-15 LAB — URINALYSIS, ROUTINE W REFLEX MICROSCOPIC
Bacteria, UA: NONE SEEN /HPF
Bilirubin Urine: NEGATIVE
Hgb urine dipstick: NEGATIVE
Hyaline Cast: NONE SEEN /LPF
Ketones, ur: NEGATIVE
Nitrite: NEGATIVE
Protein, ur: NEGATIVE
RBC / HPF: NONE SEEN /HPF (ref 0–2)
Specific Gravity, Urine: 1.028 (ref 1.001–1.035)
pH: 5.5 (ref 5.0–8.0)

## 2023-11-15 LAB — ABO AND RH

## 2023-11-15 LAB — LIPID PANEL
Cholesterol: 148 mg/dL (ref <200)
HDL: 48 mg/dL (ref >=40)
LDL Cholesterol (Calc): 83 mg/dL
Non-HDL Cholesterol (Calc): 100 mg/dL (ref <130)
Total CHOL/HDL Ratio: 3.1 (calc) (ref <5.0)
Triglycerides: 76 mg/dL (ref <150)

## 2023-11-15 LAB — HEMOGLOBIN A1C
Hgb A1c MFr Bld: 7.3 % — ABNORMAL HIGH (ref <5.7)
Mean Plasma Glucose: 163 mg/dL
eAG (mmol/L): 9 mmol/L

## 2023-11-15 LAB — MICROSCOPIC MESSAGE

## 2023-11-15 LAB — PSA: PSA: 2.59 ng/mL (ref <=4.00)

## 2023-12-05 ENCOUNTER — Ambulatory Visit

## 2023-12-05 VITALS — BP 124/72 | HR 82 | Ht 66.0 in | Wt 166.0 lb

## 2023-12-05 DIAGNOSIS — E119 Type 2 diabetes mellitus without complications: Secondary | ICD-10-CM

## 2023-12-05 DIAGNOSIS — D649 Anemia, unspecified: Secondary | ICD-10-CM | POA: Diagnosis not present

## 2023-12-05 DIAGNOSIS — K219 Gastro-esophageal reflux disease without esophagitis: Secondary | ICD-10-CM | POA: Diagnosis not present

## 2023-12-05 NOTE — Progress Notes (Signed)
 Progress Note  Physician: Parris DELENA Juneau, MD   HPI: Blake Hall is a 63 y.o. male presenting on 12/05/2023 for Follow-up .  Discussed the use of AI scribe software for clinical note transcription with the patient, who gave verbal consent to proceed.  History of Present Illness   Blake Hall is a 63 year old male who presents with newly diagnosed diabetes mellitus.  Hyperglycemia and unintentional weight loss - New diagnosis of diabetes mellitus with hemoglobin A1c of 7.3% - Unintentional weight loss of 20 pounds over the last three months - No increased thirst or frequent urination - Consumes large amounts of Kool-Aid, which is high in sugar - Prefers to manage diabetes through dietary changes rather than medication  Diabetes-related family history and awareness - Mother and brother had diabetes mellitus - Aware of diabetes complications such as neuropathy and nephropathy after witnessing brother's health decline  Gastroesophageal reflux symptoms - Experiences acid reflux - Manages symptoms by drinking milk - Previously discontinued acid reflux medications due to constipation  Psychosocial stressors - Lives alone - Recent personal losses, including the passing of his brother and sister  Medical history:  Relevant past medical, surgical, family and social history reviewed and updated as indicated. Interim medical history since our last visit reviewed.  Allergies and medications reviewed and updated.   ROS: Negative unless specifically indicated above in HPI.    Current Outpatient Medications:    tretinoin  (RETIN-A ) 0.025 % cream, Apply topically at bedtime. Will increase sun sensitivity, use sunscreen (Patient not taking: Reported on 12/05/2023), Disp: 45 g, Rfl: 2       Objective:     BP 124/72   Pulse 82   Ht 5' 6 (1.676 m)   Wt 166 lb (75.3 kg)   SpO2 98%   BMI 26.79 kg/m   Wt Readings from Last 3 Encounters:  12/05/23 166 lb  (75.3 kg)  11/14/23 165 lb 9.6 oz (75.1 kg)  02/13/15 150 lb (68 kg)    Physical Exam  Physical Exam Vitals reviewed.  Constitutional:      Appearance: Normal appearance. Well-developed with normal weight.  Cardiovascular:     Rate and Rhythm: Normal rate and regular rhythm. Normal heart sounds. Normal peripheral pulses Pulmonary:     Normal breath sounds with normal effort Skin:    General: Skin is warm and dry without noticeable rash. Neurological:     General: No focal deficit present.  Psychiatric:        Mood and Affect: Mood, behavior and cognition normal      Assessment & Plan:  No diagnosis found.  No orders of the defined types were placed in this encounter.    Assessment and Plan    Type 2 diabetes mellitus Newly diagnosed with HbA1c of 7.3. No hyperglycemia symptoms.  Suspect given elevation he is not borderline - Initiate dietary modifications to reduce sugar intake, eliminate sugary drinks. - Provide diabetic meal planning resources, encourage lean proteins, fruits, vegetables. - Order diabetes education for dietary management and glucometer use. - Provide glucometer for home glucose monitoring. - Schedule follow-up in February to recheck HbA1c, assess dietary management.  Anemia Mild anemia with slight decrease in total protein. Suspected low dietary iron intake. No symptoms. - Provide list of iron-rich foods: lean beef, chicken, [place], lentils, kidney beans, spinach, dried fruits. - Monitor hemoglobin levels during next lab check in Jan  Gastroesophageal reflux disease (GERD) Intermittent  symptoms managed with dietary adjustments. - Recommend over-the-counter Pepcid for symptom management, especially at night. - Advise avoiding foods that trigger reflux.  General Health Maintenance Scheduled for colonoscopy in December. Discussed insurance coverage and Cologuard, emphasized colonoscopy's comprehensiveness  - important given weight loss as well although  this is more likely due to DM - Proceed with scheduled colonoscopy in December.

## 2023-12-21 LAB — COLOGUARD: COLOGUARD: NEGATIVE

## 2024-02-03 ENCOUNTER — Ambulatory Visit: Admit: 2024-02-03 | Admitting: Gastroenterology

## 2024-02-03 SURGERY — COLONOSCOPY
Anesthesia: Choice

## 2024-02-24 ENCOUNTER — Other Ambulatory Visit

## 2024-02-24 DIAGNOSIS — K219 Gastro-esophageal reflux disease without esophagitis: Secondary | ICD-10-CM

## 2024-02-24 DIAGNOSIS — E119 Type 2 diabetes mellitus without complications: Secondary | ICD-10-CM

## 2024-02-25 LAB — CBC WITH DIFFERENTIAL/PLATELET
Absolute Lymphocytes: 2613 {cells}/uL (ref 850–3900)
Absolute Monocytes: 858 {cells}/uL (ref 200–950)
Basophils Absolute: 39 {cells}/uL (ref 0–200)
Basophils Relative: 0.5 %
Eosinophils Absolute: 172 {cells}/uL (ref 15–500)
Eosinophils Relative: 2.2 %
HCT: 42.8 % (ref 39.4–51.1)
Hemoglobin: 13.2 g/dL (ref 13.2–17.1)
MCH: 25.8 pg — ABNORMAL LOW (ref 27.0–33.0)
MCHC: 30.8 g/dL — ABNORMAL LOW (ref 31.6–35.4)
MCV: 83.6 fL (ref 81.4–101.7)
MPV: 9.7 fL (ref 7.5–12.5)
Monocytes Relative: 11 %
Neutro Abs: 4118 {cells}/uL (ref 1500–7800)
Neutrophils Relative %: 52.8 %
Platelets: 439 Thousand/uL — ABNORMAL HIGH (ref 140–400)
RBC: 5.12 Million/uL (ref 4.20–5.80)
RDW: 13.6 % (ref 11.0–15.0)
Total Lymphocyte: 33.5 %
WBC: 7.8 Thousand/uL (ref 3.8–10.8)

## 2024-02-25 LAB — COMPREHENSIVE METABOLIC PANEL WITH GFR
AG Ratio: 1.9 (calc) (ref 1.0–2.5)
ALT: 16 U/L (ref 9–46)
AST: 16 U/L (ref 10–35)
Albumin: 3.7 g/dL (ref 3.6–5.1)
Alkaline phosphatase (APISO): 89 U/L (ref 35–144)
BUN: 11 mg/dL (ref 7–25)
CO2: 28 mmol/L (ref 20–32)
Calcium: 9.1 mg/dL (ref 8.6–10.3)
Chloride: 106 mmol/L (ref 98–110)
Creat: 1.09 mg/dL (ref 0.70–1.35)
Globulin: 2 g/dL (ref 1.9–3.7)
Glucose, Bld: 127 mg/dL — ABNORMAL HIGH (ref 65–99)
Potassium: 5.2 mmol/L (ref 3.5–5.3)
Sodium: 142 mmol/L (ref 135–146)
Total Bilirubin: 0.4 mg/dL (ref 0.2–1.2)
Total Protein: 5.7 g/dL — ABNORMAL LOW (ref 6.1–8.1)
eGFR: 77 mL/min/1.73m2

## 2024-02-25 LAB — MICROALBUMIN / CREATININE URINE RATIO
Creatinine, Urine: 233 mg/dL (ref 20–320)
Microalb, Ur: <0.2 mg/dL

## 2024-02-25 LAB — HEMOGLOBIN A1C
Hgb A1c MFr Bld: 7.1 % — ABNORMAL HIGH
Mean Plasma Glucose: 157 mg/dL
eAG (mmol/L): 8.7 mmol/L

## 2024-03-02 ENCOUNTER — Ambulatory Visit

## 2024-03-02 VITALS — BP 110/80 | HR 65 | Ht 66.0 in | Wt 164.8 lb

## 2024-03-02 DIAGNOSIS — D75839 Thrombocytosis, unspecified: Secondary | ICD-10-CM | POA: Diagnosis not present

## 2024-03-02 DIAGNOSIS — E119 Type 2 diabetes mellitus without complications: Secondary | ICD-10-CM | POA: Diagnosis not present

## 2024-03-02 MED ORDER — ROSUVASTATIN CALCIUM 10 MG PO TABS: 10.0000 mg | ORAL_TABLET | Freq: Every day | ORAL | 3 refills | Status: AC

## 2024-03-02 MED ORDER — BLOOD GLUCOSE MONITORING SUPPL DEVI: 1.0000 | 0 refills | Status: AC

## 2024-03-02 MED ORDER — BLOOD GLUCOSE TEST VI STRP: 1.0000 | ORAL_STRIP | 0 refills | Status: AC

## 2024-03-02 MED ORDER — LANCETS MISC: 1.0000 | 0 refills | Status: AC

## 2024-03-02 MED ORDER — LANCET DEVICE MISC: 1.0000 | 0 refills | Status: AC

## 2024-03-02 NOTE — Progress Notes (Signed)
 "           Progress Note  Physician: Parris DELENA Juneau, MD   HPI: Blake Hall is a 64 y.o. male presenting on 03/02/2024 for Follow-up .  Discussed the use of AI scribe software for clinical note transcription with the patient, who gave verbal consent to proceed.  History of Present Illness   Blake Hall is a 64 year old male with diabetes who presents for follow-up on blood work and diabetes management.  Glycemic control and diabetes management - Diabetes managed through dietary modifications only; no diabetes medications used. - Hemoglobin A1c decreased from 7.3% to 7.1% since September. - No use of home glucometer - Aware of importance of monitoring to avoid complications experienced by family members. - Currently consumes sweet tea -  does not consume soda or excessive sweets otherwise. - Concerned about cost of diabetes medications.  Laboratory abnormalities - Recent blood work revealed low mean corpuscular hemoglobin [facility]) and elevated platelet count. - No history of iron deficiency.  Diabetes complication surveillance - Has an established eye doctor for regular ophthalmologic exams.         Medical history:  Relevant past medical, surgical, family and social history reviewed and updated as indicated. Interim medical history since our last visit reviewed.  Allergies and medications reviewed and updated.   ROS: Negative unless specifically indicated above in HPI.   Current Medications[1]       Objective:     BP 110/80 (BP Location: Left Arm, Patient Position: Sitting, Cuff Size: Normal)   Pulse 65   Ht 5' 6 (1.676 m)   Wt 164 lb 12.8 oz (74.8 kg)   SpO2 98%   BMI 26.60 kg/m   Wt Readings from Last 3 Encounters:  03/02/24 164 lb 12.8 oz (74.8 kg)  12/05/23 166 lb (75.3 kg)  11/14/23 165 lb 9.6 oz (75.1 kg)    Physical Exam  Physical Exam Vitals reviewed.  Constitutional:      Appearance: Normal appearance. Well-developed with normal  weight.  Cardiovascular:     Rate and Rhythm: Normal rate and regular rhythm. Normal heart sounds. Normal peripheral pulses Pulmonary:     Normal breath sounds with normal effort Skin:    General: Skin is warm and dry without noticeable rash. Neurological:     General: No focal deficit present.  Psychiatric:        Mood and Affect: Mood, behavior and cognition normal      Assessment & Plan:   Encounter Diagnoses  Name Primary?   Type 2 diabetes mellitus without complication, without long-term current use of insulin (HCC) Yes   Thrombocytosis     Orders Placed This Encounter  Procedures   Hemoglobin A1c     Assessment and Plan    Diabetes Mellitus Type 2 Recently diagnosed. A1c improved from 7.3% to 7.1% with diet. Cardiovascular risk increased.  Discussed nephropathy and retinopathy risks. Emphasized blood pressure and glucose control. Potential metformin use if diet insufficient. Recommended annual eye exams and proteinuria monitoring. Advised glucometer use. Discussed low-dose statin benefits. - Continue dietary management. - Educate on diabetic diet with ADA resources. - Provide glucometer for home monitoring. - Recommend annual eye exams. - Prescribe rosuvastatin  10 mg - Monitor for neuropathy - Recheck labs in mid-April.   Thrombocytosis Mild platelet elevation, possibly due to inflammation, infection, or iron deficiency. - Monitor platelet count in future blood work.  May be due to degenerative arthritis with chronic inflammation  F/u in  3 mo - DM                [1]  Current Outpatient Medications:    Blood Glucose Monitoring Suppl DEVI, 1 each by Does not apply route as directed. Dispense based on patient and insurance preference. Use up to four times daily as directed. (FOR ICD-10 E10.9, E11.9)., Disp: 1 each, Rfl: 0   Glucose Blood (BLOOD GLUCOSE TEST STRIPS) STRP, 1 each by Does not apply route as directed. Dispense based on patient and insurance  preference. Use up to four times daily as directed. (FOR ICD-10 E10.9, E11.9)., Disp: 100 strip, Rfl: 0   Lancet Device MISC, 1 each by Does not apply route as directed. Dispense based on patient and insurance preference. Use up to four times daily as directed. (FOR ICD-10 E10.9, E11.9)., Disp: 1 each, Rfl: 0   Lancets MISC, 1 each by Does not apply route as directed. Dispense based on patient and insurance preference. Use up to four times daily as directed. (FOR ICD-10 E10.9, E11.9)., Disp: 100 each, Rfl: 0   rosuvastatin  (CRESTOR ) 10 MG tablet, Take 1 tablet (10 mg total) by mouth daily., Disp: 90 tablet, Rfl: 3  "

## 2024-06-01 ENCOUNTER — Other Ambulatory Visit

## 2024-06-08 ENCOUNTER — Ambulatory Visit

## 2024-06-18 ENCOUNTER — Other Ambulatory Visit

## 2024-06-25 ENCOUNTER — Encounter: Admitting: Internal Medicine
